# Patient Record
Sex: Male | Born: 1985 | Race: Black or African American | Hispanic: No | Marital: Single | Smoking: Current every day smoker
Health system: Southern US, Community
[De-identification: ages and names within clinical notes are randomized; demographics above are authoritative.]

---
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
## 2005-09-24 ENCOUNTER — Emergency Department (HOSPITAL_COMMUNITY): Admission: EM | Admit: 2005-09-24 | Discharge: 2005-09-24 | Payer: Self-pay | Admitting: Family Medicine

## 2006-01-04 ENCOUNTER — Emergency Department (HOSPITAL_COMMUNITY): Admission: EM | Admit: 2006-01-04 | Discharge: 2006-01-04 | Payer: Self-pay | Admitting: Family Medicine

## 2006-01-07 ENCOUNTER — Emergency Department (HOSPITAL_COMMUNITY): Admission: EM | Admit: 2006-01-07 | Discharge: 2006-01-07 | Payer: Self-pay | Admitting: Family Medicine

## 2021-02-26 ENCOUNTER — Encounter (HOSPITAL_COMMUNITY): Payer: Self-pay

## 2021-02-26 ENCOUNTER — Emergency Department (HOSPITAL_COMMUNITY): Payer: Self-pay

## 2021-02-26 ENCOUNTER — Inpatient Hospital Stay (HOSPITAL_COMMUNITY)
Admission: EM | Admit: 2021-02-26 | Discharge: 2021-02-28 | DRG: 581 | Disposition: A | Discharge status: Home / Self Care | Payer: Self-pay | Attending: Internal Medicine | Admitting: Internal Medicine

## 2021-02-26 DIAGNOSIS — S71139A Puncture wound without foreign body, unspecified thigh, initial encounter: Secondary | ICD-10-CM

## 2021-02-26 DIAGNOSIS — F17210 Nicotine dependence, cigarettes, uncomplicated: Secondary | ICD-10-CM | POA: Diagnosis present

## 2021-02-26 DIAGNOSIS — S71132A Puncture wound without foreign body, left thigh, initial encounter: Principal | ICD-10-CM | POA: Diagnosis present

## 2021-02-26 DIAGNOSIS — Z23 Encounter for immunization: Secondary | ICD-10-CM

## 2021-02-26 DIAGNOSIS — W3400XA Accidental discharge from unspecified firearms or gun, initial encounter: Secondary | ICD-10-CM

## 2021-02-26 DIAGNOSIS — Z20822 Contact with and (suspected) exposure to covid-19: Secondary | ICD-10-CM | POA: Diagnosis present

## 2021-02-26 DIAGNOSIS — E876 Hypokalemia: Secondary | ICD-10-CM | POA: Diagnosis present

## 2021-02-26 LAB — CBC WITH DIFFERENTIAL/PLATELET
Abs Immature Granulocytes: 0.01 10*3/uL (ref 0.00–0.07)
Basophils Absolute: 0 10*3/uL (ref 0.0–0.1)
Basophils Relative: 1 %
Eosinophils Absolute: 0.2 10*3/uL (ref 0.0–0.5)
Eosinophils Relative: 3 %
HCT: 41.8 % (ref 39.0–52.0)
Hemoglobin: 12.7 g/dL — ABNORMAL LOW (ref 13.0–17.0)
Immature Granulocytes: 0 %
Lymphocytes Relative: 50 %
Lymphs Abs: 3.5 10*3/uL (ref 0.7–4.0)
MCH: 24.2 pg — ABNORMAL LOW (ref 26.0–34.0)
MCHC: 30.4 g/dL (ref 30.0–36.0)
MCV: 79.8 fL — ABNORMAL LOW (ref 80.0–100.0)
Monocytes Absolute: 0.8 10*3/uL (ref 0.1–1.0)
Monocytes Relative: 11 %
Neutro Abs: 2.4 10*3/uL (ref 1.7–7.7)
Neutrophils Relative %: 35 %
Platelets: 351 10*3/uL (ref 150–400)
RBC: 5.24 MIL/uL (ref 4.22–5.81)
RDW: 17.1 % — ABNORMAL HIGH (ref 11.5–15.5)
WBC: 6.8 10*3/uL (ref 4.0–10.5)
nRBC: 0 % (ref 0.0–0.2)

## 2021-02-26 LAB — COMPREHENSIVE METABOLIC PANEL
ALT: 14 U/L (ref 0–44)
AST: 28 U/L (ref 15–41)
Albumin: 3.5 g/dL (ref 3.5–5.0)
Alkaline Phosphatase: 90 U/L (ref 38–126)
Anion gap: 17 — ABNORMAL HIGH (ref 5–15)
BUN: 10 mg/dL (ref 6–20)
CO2: 25 mmol/L (ref 22–32)
Calcium: 8.9 mg/dL (ref 8.9–10.3)
Chloride: 97 mmol/L — ABNORMAL LOW (ref 98–111)
Creatinine, Ser: 1.27 mg/dL — ABNORMAL HIGH (ref 0.61–1.24)
GFR, Estimated: >60 mL/min (ref >60)
Glucose, Bld: 153 mg/dL — ABNORMAL HIGH (ref 70–99)
Potassium: 2.1 mmol/L — CL (ref 3.5–5.1)
Sodium: 139 mmol/L (ref 135–145)
Total Bilirubin: 0.6 mg/dL (ref 0.3–1.2)
Total Protein: 6.8 g/dL (ref 6.5–8.1)

## 2021-02-26 LAB — RESP PANEL BY RT-PCR (FLU A&B, COVID) ARPGX2
Influenza A by PCR: NEGATIVE
Influenza B by PCR: NEGATIVE
SARS Coronavirus 2 by RT PCR: NEGATIVE

## 2021-02-26 LAB — MAGNESIUM: Magnesium: 2 mg/dL (ref 1.7–2.4)

## 2021-02-26 MED ORDER — MORPHINE SULFATE (PF) 4 MG/ML IV SOLN
4.0000 mg | Freq: Once | INTRAVENOUS | Status: AC
  Administered 2021-02-26: 4 mg via INTRAVENOUS
  Filled 2021-02-26: qty 1

## 2021-02-26 MED ORDER — SODIUM CHLORIDE 0.9 % IV BOLUS
1000.0000 mL | Freq: Once | INTRAVENOUS | Status: AC
  Administered 2021-02-26: 1000 mL via INTRAVENOUS

## 2021-02-26 MED ORDER — POTASSIUM CHLORIDE 10 MEQ/100ML IV SOLN
10.0000 meq | INTRAVENOUS | Status: AC
  Administered 2021-02-26 – 2021-02-27 (×2): 10 meq via INTRAVENOUS
  Filled 2021-02-26 (×3): qty 100

## 2021-02-26 MED ORDER — FENTANYL CITRATE (PF) 100 MCG/2ML IJ SOLN
50.0000 ug | Freq: Once | INTRAMUSCULAR | Status: AC
  Administered 2021-02-26: 50 ug via INTRAVENOUS
  Filled 2021-02-26: qty 2

## 2021-02-26 MED ORDER — CEFAZOLIN SODIUM-DEXTROSE 2-4 GM/100ML-% IV SOLN
2.0000 g | INTRAVENOUS | Status: DC
  Filled 2021-02-26: qty 100

## 2021-02-26 MED ORDER — POTASSIUM CHLORIDE CRYS ER 20 MEQ PO TBCR
40.0000 meq | EXTENDED_RELEASE_TABLET | Freq: Once | ORAL | Status: AC
  Administered 2021-02-26: 40 meq via ORAL
  Filled 2021-02-26: qty 2

## 2021-02-26 MED ORDER — TETANUS-DIPHTH-ACELL PERTUSSIS 5-2.5-18.5 LF-MCG/0.5 IM SUSY
0.5000 mL | PREFILLED_SYRINGE | Freq: Once | INTRAMUSCULAR | Status: AC
Start: 2021-02-26 — End: 2021-02-26
  Administered 2021-02-26: 0.5 mL via INTRAMUSCULAR
  Filled 2021-02-26: qty 0.5
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
## 2021-02-27 ENCOUNTER — Encounter (HOSPITAL_COMMUNITY)
Admission: EM | Disposition: A | Discharge status: Home / Self Care | Payer: Self-pay | Source: Home / Self Care | Attending: Internal Medicine

## 2021-02-27 ENCOUNTER — Inpatient Hospital Stay (HOSPITAL_COMMUNITY): Payer: Self-pay | Admitting: Anesthesiology

## 2021-02-27 ENCOUNTER — Other Ambulatory Visit: Payer: Self-pay

## 2021-02-27 ENCOUNTER — Encounter (HOSPITAL_COMMUNITY): Payer: Self-pay | Admitting: Family Medicine

## 2021-02-27 DIAGNOSIS — S71132A Puncture wound without foreign body, left thigh, initial encounter: Principal | ICD-10-CM

## 2021-02-27 DIAGNOSIS — E876 Hypokalemia: Secondary | ICD-10-CM | POA: Diagnosis present

## 2021-02-27 DIAGNOSIS — S71139A Puncture wound without foreign body, unspecified thigh, initial encounter: Secondary | ICD-10-CM

## 2021-02-27 HISTORY — PX: I & D EXTREMITY: SHX5045

## 2021-02-27 LAB — CBC
HCT: 38.4 % — ABNORMAL LOW (ref 39.0–52.0)
Hemoglobin: 11.9 g/dL — ABNORMAL LOW (ref 13.0–17.0)
MCH: 24.2 pg — ABNORMAL LOW (ref 26.0–34.0)
MCHC: 31 g/dL (ref 30.0–36.0)
MCV: 78.2 fL — ABNORMAL LOW (ref 80.0–100.0)
Platelets: 303 10*3/uL (ref 150–400)
RBC: 4.91 MIL/uL (ref 4.22–5.81)
RDW: 16.8 % — ABNORMAL HIGH (ref 11.5–15.5)
WBC: 6.2 10*3/uL (ref 4.0–10.5)
nRBC: 0 % (ref 0.0–0.2)

## 2021-02-27 LAB — BASIC METABOLIC PANEL
Anion gap: 8 (ref 5–15)
BUN: 6 mg/dL (ref 6–20)
CO2: 32 mmol/L (ref 22–32)
Calcium: 8.3 mg/dL — ABNORMAL LOW (ref 8.9–10.3)
Chloride: 100 mmol/L (ref 98–111)
Creatinine, Ser: 0.76 mg/dL (ref 0.61–1.24)
GFR, Estimated: >60 mL/min (ref >60)
Glucose, Bld: 101 mg/dL — ABNORMAL HIGH (ref 70–99)
Potassium: 2.8 mmol/L — ABNORMAL LOW (ref 3.5–5.1)
Sodium: 140 mmol/L (ref 135–145)

## 2021-02-27 LAB — SURGICAL PCR SCREEN
MRSA, PCR: NEGATIVE
Staphylococcus aureus: POSITIVE — AB

## 2021-02-27 LAB — HIV ANTIBODY (ROUTINE TESTING W REFLEX): HIV Screen 4th Generation wRfx: NONREACTIVE

## 2021-02-27 SURGERY — IRRIGATION AND DEBRIDEMENT EXTREMITY
Anesthesia: General | Site: Thigh | Laterality: Left

## 2021-02-27 MED ORDER — HYDROMORPHONE HCL 1 MG/ML IJ SOLN
INTRAMUSCULAR | Status: AC
  Filled 2021-02-27: qty 1

## 2021-02-27 MED ORDER — SODIUM CHLORIDE 0.9 % IV SOLN
2.0000 g | INTRAVENOUS | Status: DC
  Administered 2021-02-27: 2 g via INTRAVENOUS
  Filled 2021-02-27: qty 20

## 2021-02-27 MED ORDER — OXYCODONE HCL 5 MG/5ML PO SOLN: 5.0000 mg | Freq: Once | ORAL | Status: DC | PRN

## 2021-02-27 MED ORDER — ACETAMINOPHEN 325 MG PO TABS
650.0000 mg | ORAL_TABLET | Freq: Four times a day (QID) | ORAL | Status: DC | PRN
  Filled 2021-02-27: qty 2

## 2021-02-27 MED ORDER — MIDAZOLAM HCL 5 MG/5ML IJ SOLN
INTRAMUSCULAR | Status: DC | PRN
  Administered 2021-02-27: 2 mg via INTRAVENOUS

## 2021-02-27 MED ORDER — LACTATED RINGERS IV SOLN: INTRAVENOUS | Status: DC

## 2021-02-27 MED ORDER — PROPOFOL 10 MG/ML IV BOLUS
INTRAVENOUS | Status: AC
  Filled 2021-02-27: qty 20

## 2021-02-27 MED ORDER — POTASSIUM CHLORIDE 10 MEQ/100ML IV SOLN
10.0000 meq | INTRAVENOUS | Status: AC
  Administered 2021-02-27 (×3): 10 meq via INTRAVENOUS
  Filled 2021-02-27: qty 100

## 2021-02-27 MED ORDER — LABETALOL HCL 5 MG/ML IV SOLN: 10.0000 mg | INTRAVENOUS | Status: DC | PRN

## 2021-02-27 MED ORDER — ACETAMINOPHEN 650 MG RE SUPP: 650.0000 mg | Freq: Four times a day (QID) | RECTAL | Status: DC | PRN

## 2021-02-27 MED ORDER — ORAL CARE MOUTH RINSE: 15.0000 mL | Freq: Once | OROMUCOSAL | Status: AC

## 2021-02-27 MED ORDER — ONDANSETRON HCL 4 MG/2ML IJ SOLN
INTRAMUSCULAR | Status: DC | PRN
  Administered 2021-02-27: 4 mg via INTRAVENOUS

## 2021-02-27 MED ORDER — ONDANSETRON HCL 4 MG/2ML IJ SOLN: 4.0000 mg | Freq: Four times a day (QID) | INTRAMUSCULAR | Status: DC | PRN

## 2021-02-27 MED ORDER — CHLORHEXIDINE GLUCONATE 0.12 % MT SOLN: 15.0000 mL | Freq: Once | OROMUCOSAL | Status: AC

## 2021-02-27 MED ORDER — FENTANYL CITRATE (PF) 250 MCG/5ML IJ SOLN
INTRAMUSCULAR | Status: DC | PRN
  Administered 2021-02-27: 50 ug via INTRAVENOUS

## 2021-02-27 MED ORDER — LABETALOL HCL 5 MG/ML IV SOLN
5.0000 mg | INTRAVENOUS | Status: AC | PRN
  Administered 2021-02-27 (×2): 5 mg via INTRAVENOUS

## 2021-02-27 MED ORDER — POVIDONE-IODINE 10 % EX SWAB
2.0000 "application " | Freq: Once | CUTANEOUS | Status: AC
  Administered 2021-02-27: 2 via TOPICAL

## 2021-02-27 MED ORDER — DEXAMETHASONE SODIUM PHOSPHATE 10 MG/ML IJ SOLN
INTRAMUSCULAR | Status: DC | PRN
  Administered 2021-02-27: 4 mg via INTRAVENOUS

## 2021-02-27 MED ORDER — PROPOFOL 10 MG/ML IV BOLUS
INTRAVENOUS | Status: DC | PRN
  Administered 2021-02-27: 200 mg via INTRAVENOUS

## 2021-02-27 MED ORDER — LABETALOL HCL 5 MG/ML IV SOLN
INTRAVENOUS | Status: AC
  Filled 2021-02-27: qty 4

## 2021-02-27 MED ORDER — SODIUM CHLORIDE 0.9 % IR SOLN
Status: DC | PRN
  Administered 2021-02-27: 3000 mL

## 2021-02-27 MED ORDER — OXYCODONE HCL 5 MG PO TABS: 5.0000 mg | ORAL_TABLET | Freq: Once | ORAL | Status: DC | PRN

## 2021-02-27 MED ORDER — OXYCODONE-ACETAMINOPHEN 5-325 MG PO TABS
1.0000 | ORAL_TABLET | Freq: Four times a day (QID) | ORAL | Status: DC | PRN
  Administered 2021-02-27 – 2021-02-28 (×4): 2 via ORAL
  Filled 2021-02-27 (×4): qty 2

## 2021-02-27 MED ORDER — LIDOCAINE 2% (20 MG/ML) 5 ML SYRINGE
INTRAMUSCULAR | Status: DC | PRN
  Administered 2021-02-27: 40 mg via INTRAVENOUS

## 2021-02-27 MED ORDER — 0.9 % SODIUM CHLORIDE (POUR BTL) OPTIME
TOPICAL | Status: DC | PRN
  Administered 2021-02-27: 1000 mL

## 2021-02-27 MED ORDER — CHLORHEXIDINE GLUCONATE 4 % EX LIQD: 60.0000 mL | Freq: Once | CUTANEOUS | Status: DC

## 2021-02-27 MED ORDER — FENTANYL CITRATE (PF) 250 MCG/5ML IJ SOLN
INTRAMUSCULAR | Status: AC
  Filled 2021-02-27: qty 5

## 2021-02-27 MED ORDER — FENTANYL CITRATE (PF) 100 MCG/2ML IJ SOLN
25.0000 ug | INTRAMUSCULAR | Status: DC | PRN
  Administered 2021-02-27 (×3): 50 ug via INTRAVENOUS

## 2021-02-27 MED ORDER — HYDROMORPHONE HCL 1 MG/ML IJ SOLN
1.0000 mg | INTRAMUSCULAR | Status: AC | PRN
  Administered 2021-02-27 (×3): 1 mg via INTRAVENOUS
  Filled 2021-02-27 (×3): qty 1

## 2021-02-27 MED ORDER — ONDANSETRON HCL 4 MG/2ML IJ SOLN
INTRAMUSCULAR | Status: AC
  Filled 2021-02-27: qty 2

## 2021-02-27 MED ORDER — CHLORHEXIDINE GLUCONATE 0.12 % MT SOLN
OROMUCOSAL | Status: AC
  Administered 2021-02-27: 15 mL via OROMUCOSAL
  Filled 2021-02-27: qty 15

## 2021-02-27 MED ORDER — METHOCARBAMOL 750 MG PO TABS
750.0000 mg | ORAL_TABLET | Freq: Three times a day (TID) | ORAL | Status: DC
  Administered 2021-02-27 – 2021-02-28 (×2): 750 mg via ORAL
  Filled 2021-02-27 (×2): qty 1

## 2021-02-27 MED ORDER — MIDAZOLAM HCL 2 MG/2ML IJ SOLN
INTRAMUSCULAR | Status: AC
  Filled 2021-02-27: qty 2

## 2021-02-27 MED ORDER — ASCORBIC ACID 500 MG PO TABS
1000.0000 mg | ORAL_TABLET | Freq: Every day | ORAL | Status: DC
  Administered 2021-02-27 – 2021-02-28 (×2): 1000 mg via ORAL
  Filled 2021-02-27 (×2): qty 2

## 2021-02-27 MED ORDER — NICOTINE 14 MG/24HR TD PT24
14.0000 mg | MEDICATED_PATCH | Freq: Every day | TRANSDERMAL | Status: DC
  Filled 2021-02-27 (×2): qty 1

## 2021-02-27 MED ORDER — CEFAZOLIN SODIUM-DEXTROSE 2-4 GM/100ML-% IV SOLN
2.0000 g | INTRAVENOUS | Status: AC
  Administered 2021-02-27: 2 g via INTRAVENOUS
  Filled 2021-02-27 (×2): qty 100

## 2021-02-27 MED ORDER — FENTANYL CITRATE (PF) 100 MCG/2ML IJ SOLN
INTRAMUSCULAR | Status: AC
  Filled 2021-02-27: qty 2

## 2021-02-27 MED ORDER — ONDANSETRON HCL 4 MG PO TABS: 4.0000 mg | ORAL_TABLET | Freq: Four times a day (QID) | ORAL | Status: DC | PRN

## 2021-02-27 MED ORDER — LABETALOL HCL 5 MG/ML IV SOLN
5.0000 mg | INTRAVENOUS | Status: DC | PRN
  Administered 2021-02-27: 5 mg via INTRAVENOUS

## 2021-02-27 SURGICAL SUPPLY — 49 items
BAG COUNTER SPONGE SURGICOUNT (BAG) ×1 IMPLANT
BAG DRAIN URO-CYSTO SKYTR STRL (DRAIN) ×1 IMPLANT
BAG DRN UROCATH (DRAIN) ×1
BAG SPNG CNTER NS LX DISP (BAG) ×1
BNDG COHESIVE 4X5 TAN STRL (GAUZE/BANDAGES/DRESSINGS) ×2 IMPLANT
BNDG ELASTIC 4X5.8 VLCR STR LF (GAUZE/BANDAGES/DRESSINGS) ×1 IMPLANT
BNDG ELASTIC 6X5.8 VLCR STR LF (GAUZE/BANDAGES/DRESSINGS) ×1 IMPLANT
BNDG GAUZE ELAST 4 BULKY (GAUZE/BANDAGES/DRESSINGS) ×3 IMPLANT
BRUSH SCRUB EZ PLAIN DRY (MISCELLANEOUS) ×4 IMPLANT
COVER SURGICAL LIGHT HANDLE (MISCELLANEOUS) ×4 IMPLANT
DRAPE U-SHAPE 47X51 STRL (DRAPES) ×2 IMPLANT
DRSG ADAPTIC 3X8 NADH LF (GAUZE/BANDAGES/DRESSINGS) ×1 IMPLANT
DRSG MEPITEL 4X7.2 (GAUZE/BANDAGES/DRESSINGS) ×1 IMPLANT
DRSG PAD ABDOMINAL 8X10 ST (GAUZE/BANDAGES/DRESSINGS) ×1 IMPLANT
ELECT REM PT RETURN 9FT ADLT (ELECTROSURGICAL)
ELECTRODE REM PT RTRN 9FT ADLT (ELECTROSURGICAL) IMPLANT
GAUZE SPONGE 4X4 12PLY STRL (GAUZE/BANDAGES/DRESSINGS) ×2 IMPLANT
GLOVE SRG 8 PF TXTR STRL LF DI (GLOVE) ×1 IMPLANT
GLOVE SURG ENC MOIS LTX SZ7.5 (GLOVE) ×2 IMPLANT
GLOVE SURG ENC MOIS LTX SZ8 (GLOVE) ×2 IMPLANT
GLOVE SURG UNDER POLY LF SZ7.5 (GLOVE) ×2 IMPLANT
GLOVE SURG UNDER POLY LF SZ8 (GLOVE) ×2
GLOVE SURG UNDER POLY LF SZ9 (GLOVE) ×2 IMPLANT
GOWN STRL REUS W/ TWL LRG LVL3 (GOWN DISPOSABLE) ×2 IMPLANT
GOWN STRL REUS W/ TWL XL LVL3 (GOWN DISPOSABLE) ×1 IMPLANT
GOWN STRL REUS W/TWL LRG LVL3 (GOWN DISPOSABLE) ×2
GOWN STRL REUS W/TWL XL LVL3 (GOWN DISPOSABLE) ×2
HANDPIECE INTERPULSE COAX TIP (DISPOSABLE)
KIT BASIN OR (CUSTOM PROCEDURE TRAY) ×2 IMPLANT
KIT TURNOVER KIT B (KITS) ×2 IMPLANT
MANIFOLD NEPTUNE II (INSTRUMENTS) ×2 IMPLANT
NS IRRIG 1000ML POUR BTL (IV SOLUTION) ×2 IMPLANT
PACK ORTHO EXTREMITY (CUSTOM PROCEDURE TRAY) ×2 IMPLANT
PAD ARMBOARD 7.5X6 YLW CONV (MISCELLANEOUS) ×4 IMPLANT
PADDING CAST COTTON 6X4 STRL (CAST SUPPLIES) ×2 IMPLANT
SET HNDPC FAN SPRY TIP SCT (DISPOSABLE) IMPLANT
SOL PREP POV-IOD 4OZ 10% (MISCELLANEOUS) ×2 IMPLANT
SOL PREP PROV IODINE SCRUB 4OZ (MISCELLANEOUS) ×2 IMPLANT
SPONGE T-LAP 18X18 ~~LOC~~+RFID (SPONGE) ×3 IMPLANT
STOCKINETTE IMPERVIOUS 9X36 MD (GAUZE/BANDAGES/DRESSINGS) ×1 IMPLANT
SUT ETHILON 2 0 PSLX (SUTURE) ×1 IMPLANT
SUT PDS AB 0 CT 36 (SUTURE) ×1 IMPLANT
SUT PDS AB 2-0 CT1 27 (SUTURE) ×1 IMPLANT
TOWEL GREEN STERILE (TOWEL DISPOSABLE) ×4 IMPLANT
TOWEL GREEN STERILE FF (TOWEL DISPOSABLE) ×2 IMPLANT
TUBE CONNECTING 12X1/4 (SUCTIONS) ×2 IMPLANT
UNDERPAD 30X36 HEAVY ABSORB (UNDERPADS AND DIAPERS) ×3 IMPLANT
WATER STERILE IRR 1000ML POUR (IV SOLUTION) ×2 IMPLANT
YANKAUER SUCT BULB TIP NO VENT (SUCTIONS) ×2 IMPLANT
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
## 2021-02-27 NOTE — Plan of Care (Signed)
  Problem: Education: Goal: Knowledge of General Education information will improve Description Including pain rating scale, medication(s)/side effects and non-pharmacologic comfort measures Outcome: Progressing   Problem: Health Behavior/Discharge Planning: Goal: Ability to manage health-related needs will improve Outcome: Progressing   
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
## 2021-02-27 NOTE — ED Notes (Signed)
Mother phone number- [phone] Darel Hong)

## 2021-02-27 NOTE — Anesthesia Procedure Notes (Signed)
Procedure Name: LMA Insertion Date/Time: 02/27/2021 3:33 PM Performed by: Lucinda Dell, CRNA Pre-anesthesia Checklist: Patient identified, Emergency Drugs available, Suction available and Patient being monitored Patient Re-evaluated:Patient Re-evaluated prior to induction Oxygen Delivery Method: Circle system utilized Preoxygenation: Pre-oxygenation with 100% oxygen Induction Type: IV induction Ventilation: Mask ventilation without difficulty LMA: LMA inserted LMA Size: 5.0 Tube type: Oral Number of attempts: 1 Placement Confirmation: positive ETCO2 and breath sounds checked- equal and bilateral Tube secured with: Tape Dental Injury: Teeth and Oropharynx as per pre-operative assessment
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
## 2021-02-28 ENCOUNTER — Encounter (HOSPITAL_COMMUNITY): Payer: Self-pay | Admitting: Orthopedic Surgery

## 2021-02-28 LAB — BASIC METABOLIC PANEL
Anion gap: 11 (ref 5–15)
BUN: 5 mg/dL — ABNORMAL LOW (ref 6–20)
CO2: 30 mmol/L (ref 22–32)
Calcium: 9.1 mg/dL (ref 8.9–10.3)
Chloride: 96 mmol/L — ABNORMAL LOW (ref 98–111)
Creatinine, Ser: 0.78 mg/dL (ref 0.61–1.24)
GFR, Estimated: >60 mL/min (ref >60)
Glucose, Bld: 122 mg/dL — ABNORMAL HIGH (ref 70–99)
Potassium: 2.8 mmol/L — ABNORMAL LOW (ref 3.5–5.1)
Sodium: 137 mmol/L (ref 135–145)

## 2021-02-28 LAB — CBC
HCT: 42 % (ref 39.0–52.0)
Hemoglobin: 12.9 g/dL — ABNORMAL LOW (ref 13.0–17.0)
MCH: 24.1 pg — ABNORMAL LOW (ref 26.0–34.0)
MCHC: 30.7 g/dL (ref 30.0–36.0)
MCV: 78.5 fL — ABNORMAL LOW (ref 80.0–100.0)
Platelets: 321 10*3/uL (ref 150–400)
RBC: 5.35 MIL/uL (ref 4.22–5.81)
RDW: 16.9 % — ABNORMAL HIGH (ref 11.5–15.5)
WBC: 7.8 10*3/uL (ref 4.0–10.5)
nRBC: 0 % (ref 0.0–0.2)

## 2021-02-28 LAB — PHOSPHORUS: Phosphorus: 2.2 mg/dL — ABNORMAL LOW (ref 2.5–4.6)

## 2021-02-28 LAB — MAGNESIUM: Magnesium: 1.7 mg/dL (ref 1.7–2.4)

## 2021-02-28 MED ORDER — POTASSIUM PHOSPHATE MONOBASIC 500 MG PO TABS: 500.0000 mg | ORAL_TABLET | Freq: Two times a day (BID) | ORAL | 0 refills | Status: AC

## 2021-02-28 MED ORDER — MUPIROCIN 2 % EX OINT
TOPICAL_OINTMENT | Freq: Two times a day (BID) | CUTANEOUS | Status: DC
  Filled 2021-02-28: qty 22

## 2021-02-28 MED ORDER — POTASSIUM & SODIUM PHOSPHATES 280-160-250 MG PO PACK
1.0000 | PACK | Freq: Three times a day (TID) | ORAL | Status: DC
  Administered 2021-02-28 (×2): 1 via ORAL
  Filled 2021-02-28: qty 1
  Filled 2021-02-28: qty 2
  Filled 2021-02-28 (×2): qty 1
  Filled 2021-02-28: qty 2
  Filled 2021-02-28: qty 1

## 2021-02-28 MED ORDER — IBUPROFEN 600 MG PO TABS: 600.0000 mg | ORAL_TABLET | Freq: Three times a day (TID) | ORAL | 0 refills | Status: AC | PRN

## 2021-02-28 MED ORDER — POTASSIUM CHLORIDE CRYS ER 20 MEQ PO TBCR
40.0000 meq | EXTENDED_RELEASE_TABLET | Freq: Once | ORAL | Status: AC
  Administered 2021-02-28: 40 meq via ORAL
  Filled 2021-02-28: qty 2
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
## 2021-02-28 NOTE — Discharge Instructions (Signed)
DISCHARGE WOUND CARE INSTRUCTIONS   Discharge Wound Care Instructions  Do NOT apply any ointments, solutions or lotions to pin sites or surgical wounds.  These prevent needed drainage and even though solutions like hydrogen peroxide kill bacteria, they also damage cells lining the pin sites that help fight infection.  Applying lotions or ointments can keep the wounds moist and can cause them to breakdown and open up as well. This can increase the risk for infection. When in doubt call the office.  Surgical incisions should be dressed daily starting on 03/02/2021  If any drainage is noted, use one layer of adaptic, then gauze, and tape  Once the incision is completely dry and without drainage, it may be left open to air out.  Showering may begin 36-48 hours later.  Cleaning gently with soap and water.  Traumatic wounds should be dressed daily as well.    One layer of adaptic, gauze, Kerlix, then ace wrap.  The adaptic can be discontinued once the draining has ceased    If you have a wet to dry dressing: wet the gauze with saline the squeeze as much saline out so the gauze is moist (not soaking wet), place moistened gauze over wound, then place a dry gauze over the moist one, followed by Kerlix wrap, then ace wrap.  CALL OFFICE WITH QUESTIONS OR CONCERNS [phone]

## 2021-02-28 NOTE — Anesthesia Postprocedure Evaluation (Signed)
Anesthesia Post Note  Patient: Russell Tran  Procedure(s) Performed: IRRIGATION AND DEBRIDEMENT THIGH, EXPLORATION OF LEFT THIGH PENETRATING WOUND (Left: Thigh)     Patient location during evaluation: PACU Anesthesia Type: General Level of consciousness: awake and alert Pain management: pain level controlled Vital Signs Assessment: post-procedure vital signs reviewed and stable Respiratory status: spontaneous breathing, nonlabored ventilation, respiratory function stable and patient connected to nasal cannula oxygen Cardiovascular status: blood pressure returned to baseline and stable Postop Assessment: no apparent nausea or vomiting Anesthetic complications: no   No notable events documented.        Shelton Silvas

## 2021-02-28 NOTE — Plan of Care (Addendum)
Patient discharged home with girlfriend. All belongings taken with patient. IV removed. Patient stable.    Problem: Education: Goal: Knowledge of General Education information will improve Description: Including pain rating scale, medication(s)/side effects and non-pharmacologic comfort measures Outcome: Progressing   Problem: Health Behavior/Discharge Planning: Goal: Ability to manage health-related needs will improve Outcome: Progressing
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
## 2021-03-18 ENCOUNTER — Emergency Department (HOSPITAL_COMMUNITY)
Admission: EM | Admit: 2021-03-18 | Discharge: 2021-03-18 | Disposition: A | Discharge status: Home / Self Care | Payer: Self-pay | Attending: Emergency Medicine | Admitting: Emergency Medicine

## 2021-03-18 ENCOUNTER — Other Ambulatory Visit: Payer: Self-pay

## 2021-03-18 ENCOUNTER — Encounter (HOSPITAL_COMMUNITY): Payer: Self-pay | Admitting: Emergency Medicine

## 2021-03-18 DIAGNOSIS — W3400XD Accidental discharge from unspecified firearms or gun, subsequent encounter: Secondary | ICD-10-CM | POA: Insufficient documentation

## 2021-03-18 DIAGNOSIS — Z4802 Encounter for removal of sutures: Secondary | ICD-10-CM | POA: Insufficient documentation

## 2021-03-18 DIAGNOSIS — F17210 Nicotine dependence, cigarettes, uncomplicated: Secondary | ICD-10-CM | POA: Insufficient documentation

## 2021-03-18 DIAGNOSIS — S71112D Laceration without foreign body, left thigh, subsequent encounter: Secondary | ICD-10-CM | POA: Insufficient documentation

## 2021-03-18 NOTE — ED Notes (Signed)
Pt refused vitals 

## 2021-03-18 NOTE — ED Triage Notes (Signed)
Patient requesting suture removal at left thigh .

## 2021-03-18 NOTE — ED Notes (Signed)
Pt ambulated off unit with steady gait before signing or receiving d/c papers
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
## 2021-07-03 ENCOUNTER — Emergency Department (HOSPITAL_COMMUNITY)
Admission: EM | Admit: 2021-07-03 | Discharge: 2021-07-30 | Disposition: E | Payer: Self-pay | Attending: Emergency Medicine | Admitting: Emergency Medicine

## 2021-07-03 DIAGNOSIS — F17210 Nicotine dependence, cigarettes, uncomplicated: Secondary | ICD-10-CM | POA: Insufficient documentation

## 2021-07-03 DIAGNOSIS — I469 Cardiac arrest, cause unspecified: Secondary | ICD-10-CM | POA: Insufficient documentation

## 2021-07-03 DIAGNOSIS — R140 Abdominal distension (gaseous): Secondary | ICD-10-CM | POA: Insufficient documentation

## 2021-07-03 DIAGNOSIS — R7309 Other abnormal glucose: Secondary | ICD-10-CM | POA: Insufficient documentation

## 2021-07-03 LAB — CBG MONITORING, ED: Glucose-Capillary: 201 mg/dL — ABNORMAL HIGH (ref 70–99)

## 2021-07-30 NOTE — Progress Notes (Signed)
   14-Jul-2021 1020  Clinical Encounter Type  Visited With Patient and family together  Visit Type Death  Referral From Chaplain (Chaplain Marchelle Folks)  Consult/Referral To Chaplain   Chaplain Tery Sanfilippo responded to referral to support the patient's family. The patient's daughter Tillie Rung) was at his bedside. Chaplain Tery Sanfilippo listens empathically to Katoura's painful account of how she will miss her father. Chaplain Lindzey Zent accompanied Dr. Jesusita Oka to consultation Rm A, where he provided the death update to the patient's mother, Hoover Browns, and his sister, Makyle Eslick. Donnajean Lopes communicated nonverbally empathy and support during the consultation with the MD. Donnajean Lopes provided spiritual support via reading from the sacred text and prayer. The patient's brother arrived, but because he appeared angry, Ms. Darel Hong said the family needed to leave. Mukund Weinreb will be the contact person for the family. The family has not selected a funeral home. Chaplain Dondra Spry gave Allicia the Patient Placement card. Allicia's address is [address] [place], [place] [address] [phone]. Mother is Hoover Browns, [phone]. This note was prepared by Deneen Harts, M.Div..  For questions please contact by phone [phone].
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
## 2021-07-30 DEATH — deceased
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
